# Patient Record
Sex: Female | Born: 1995 | Race: Black or African American | Hispanic: No | Marital: Married | Smoking: Never smoker
Health system: Southern US, Community
[De-identification: ages and names within clinical notes are randomized; demographics above are authoritative.]

---
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
## 2017-12-02 ENCOUNTER — Ambulatory Visit: Payer: Self-pay | Admitting: Family Medicine

## 2019-05-14 ENCOUNTER — Other Ambulatory Visit: Payer: Self-pay

## 2019-05-14 ENCOUNTER — Emergency Department: Payer: No Typology Code available for payment source

## 2019-05-14 ENCOUNTER — Emergency Department
Admission: EM | Admit: 2019-05-14 | Discharge: 2019-05-14 | Disposition: A | Discharge status: Home / Self Care | Payer: No Typology Code available for payment source | Attending: Emergency Medicine | Admitting: Emergency Medicine

## 2019-05-14 ENCOUNTER — Encounter: Payer: Self-pay | Admitting: Emergency Medicine

## 2019-05-14 DIAGNOSIS — Y92410 Unspecified street and highway as the place of occurrence of the external cause: Secondary | ICD-10-CM | POA: Diagnosis not present

## 2019-05-14 DIAGNOSIS — Y9389 Activity, other specified: Secondary | ICD-10-CM | POA: Insufficient documentation

## 2019-05-14 DIAGNOSIS — S298XXA Other specified injuries of thorax, initial encounter: Secondary | ICD-10-CM

## 2019-05-14 DIAGNOSIS — S299XXA Unspecified injury of thorax, initial encounter: Secondary | ICD-10-CM | POA: Diagnosis present

## 2019-05-14 DIAGNOSIS — Y998 Other external cause status: Secondary | ICD-10-CM | POA: Diagnosis not present

## 2019-05-14 MED ORDER — BACLOFEN 10 MG PO TABS: 10.0000 mg | ORAL_TABLET | Freq: Three times a day (TID) | ORAL | 1 refills | Status: AC

## 2019-05-14 MED ORDER — IBUPROFEN 800 MG PO TABS: 800.0000 mg | ORAL_TABLET | Freq: Three times a day (TID) | ORAL | 0 refills | Status: DC | PRN

## 2019-05-14 NOTE — Discharge Instructions (Addendum)
Follow up with your regular doctor if not better in 5 to 7 days.  Or see acute care or orthopedics.  Apply ice to the areas that hurt.  Drink plenty of water.  Return if needed
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
## 2019-10-11 ENCOUNTER — Ambulatory Visit (LOCAL_COMMUNITY_HEALTH_CENTER): Payer: Self-pay

## 2019-10-11 ENCOUNTER — Other Ambulatory Visit: Payer: Self-pay

## 2019-10-11 DIAGNOSIS — Z23 Encounter for immunization: Secondary | ICD-10-CM

## 2019-10-24 ENCOUNTER — Ambulatory Visit (LOCAL_COMMUNITY_HEALTH_CENTER): Payer: BC Managed Care – PPO

## 2019-10-24 ENCOUNTER — Other Ambulatory Visit: Payer: Self-pay

## 2019-10-24 VITALS — BP 132/83 | Ht 70.0 in | Wt 187.5 lb

## 2019-10-24 DIAGNOSIS — Z30013 Encounter for initial prescription of injectable contraceptive: Secondary | ICD-10-CM

## 2019-10-24 DIAGNOSIS — Z3009 Encounter for other general counseling and advice on contraception: Secondary | ICD-10-CM

## 2019-10-24 MED ORDER — MEDROXYPROGESTERONE ACETATE 150 MG/ML IM SUSP
150.0000 mg | Freq: Once | INTRAMUSCULAR | Status: AC
  Administered 2019-10-24: 10:00:00 150 mg via INTRAMUSCULAR

## 2019-10-24 MED ORDER — MULTI-VITAMIN/MINERALS PO TABS: 1.0000 | ORAL_TABLET | Freq: Every day | ORAL | 0 refills | Status: AC

## 2019-10-24 NOTE — Progress Notes (Signed)
Client's last [facility] physical was in 01/2017. Per [facility] record, Nexplanon removed 03/2019 and Depo initiated with last Depo administered 06/2019. Client presents today for Depo at 14 weeks, 1 day following last dose. Consult with Ola Spurr CNM with following verbal orders given: 1) Depo 150 mg IM today and 2) schedule physical when next Depo due (01/09/2020). Client counseled regarding above and voiced understanding. Tolerated Depo injection without complaint. Rich Number, RN

## 2019-11-05 ENCOUNTER — Other Ambulatory Visit: Payer: Self-pay

## 2019-11-05 ENCOUNTER — Emergency Department: Payer: Self-pay

## 2019-11-05 DIAGNOSIS — W010XXA Fall on same level from slipping, tripping and stumbling without subsequent striking against object, initial encounter: Secondary | ICD-10-CM | POA: Insufficient documentation

## 2019-11-05 DIAGNOSIS — S022XXA Fracture of nasal bones, initial encounter for closed fracture: Secondary | ICD-10-CM | POA: Insufficient documentation

## 2019-11-05 DIAGNOSIS — Y92009 Unspecified place in unspecified non-institutional (private) residence as the place of occurrence of the external cause: Secondary | ICD-10-CM | POA: Insufficient documentation

## 2019-11-05 DIAGNOSIS — Y999 Unspecified external cause status: Secondary | ICD-10-CM | POA: Insufficient documentation

## 2019-11-05 DIAGNOSIS — Y9302 Activity, running: Secondary | ICD-10-CM | POA: Insufficient documentation

## 2019-11-05 NOTE — ED Triage Notes (Signed)
Patient here tonight because she thinks her nose is broken. [place] one week ago and has had swelling to the bridge of the nose since then. Presents tonight because she wants it checked out.

## 2019-11-06 ENCOUNTER — Emergency Department
Admission: EM | Admit: 2019-11-06 | Discharge: 2019-11-06 | Disposition: A | Discharge status: Home / Self Care | Payer: Self-pay | Attending: Emergency Medicine | Admitting: Emergency Medicine

## 2019-11-06 DIAGNOSIS — S022XXA Fracture of nasal bones, initial encounter for closed fracture: Secondary | ICD-10-CM

## 2019-11-06 MED ORDER — IBUPROFEN 600 MG PO TABS: 600.0000 mg | ORAL_TABLET | Freq: Four times a day (QID) | ORAL | 0 refills | Status: AC | PRN

## 2019-11-06 NOTE — ED Notes (Signed)
MD in room with patient
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
## 2020-02-23 IMAGING — CR DG NASAL BONES 3+V
3 series · 3 of 3 positions shown · non-contrast
Comparison: None.

CLINICAL DATA: Pain

EXAM:
NASAL BONES - 3+ VIEW

[nasal waters]
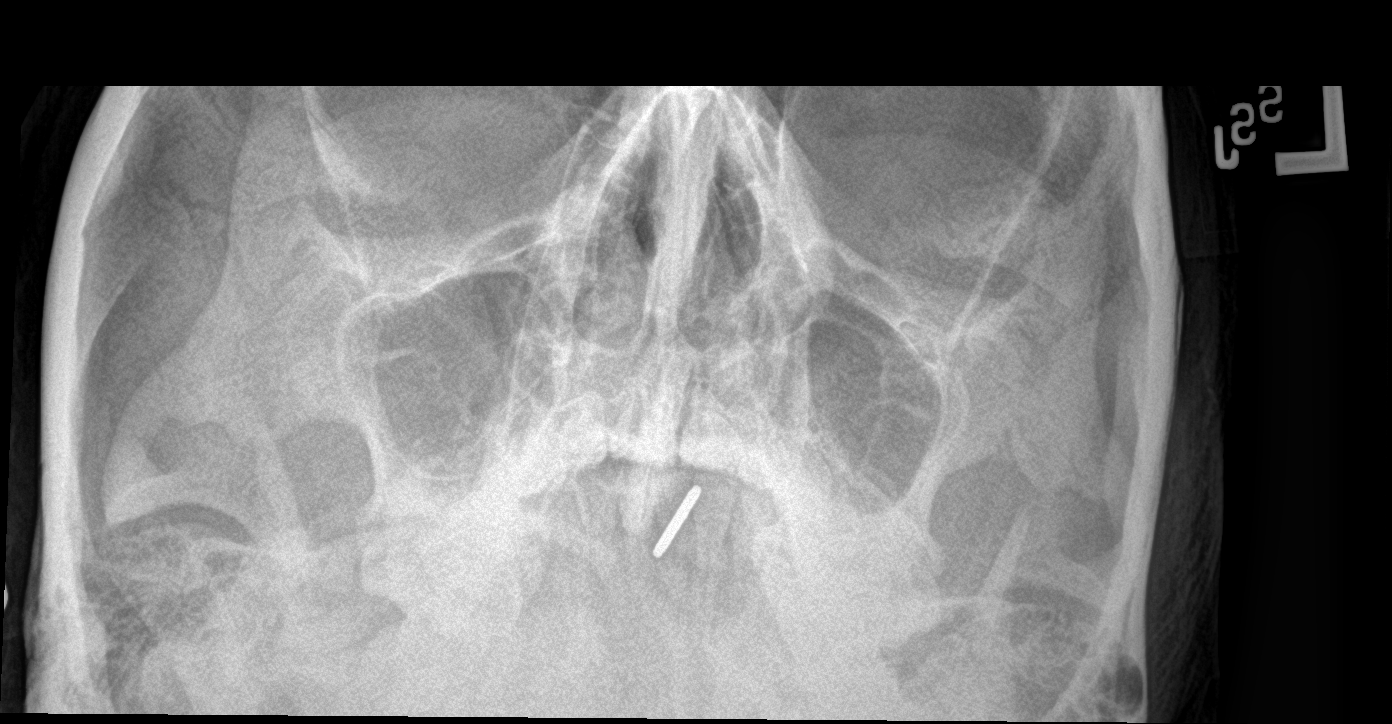

[nasal lat (1 of 2)]
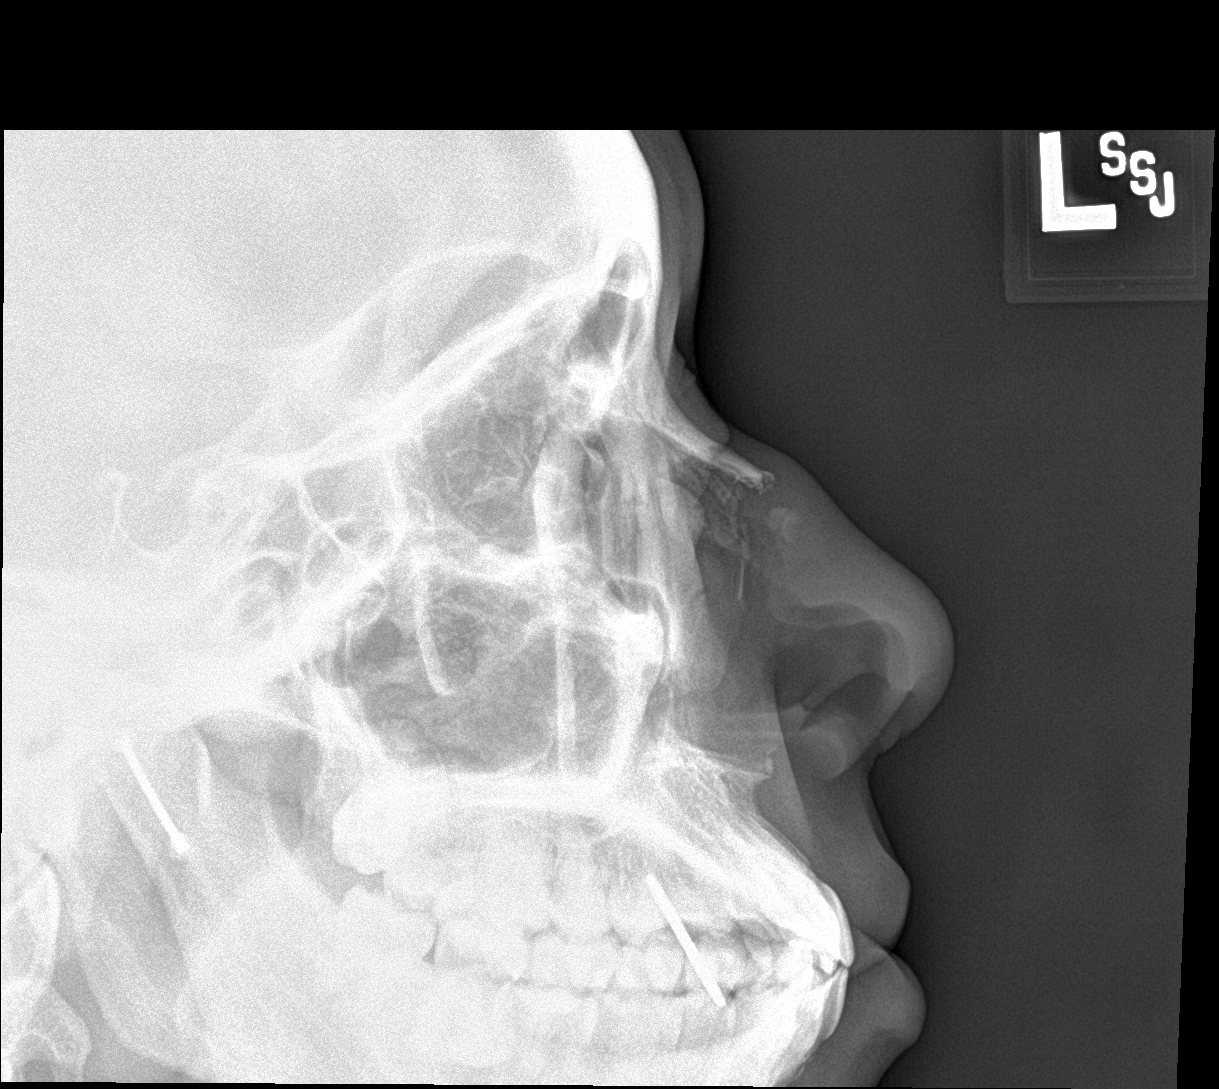

[nasal lat (2 of 2)]
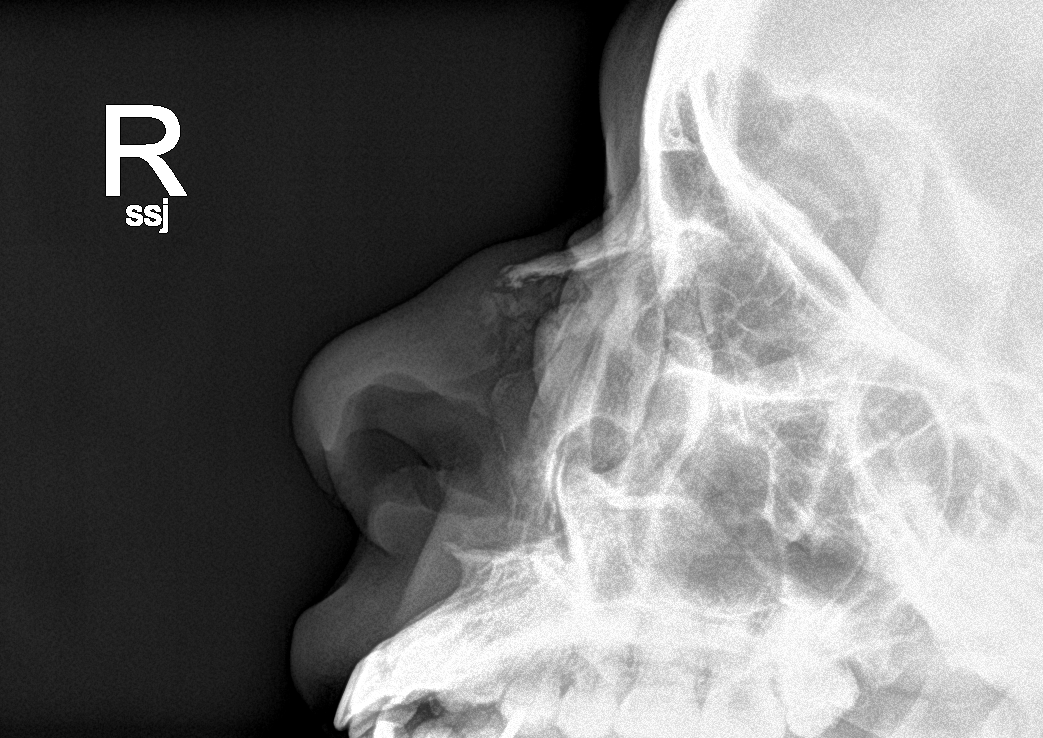

[3 of 3 positions shown; findings below may reference images not displayed]

FINDINGS: There is mild fragmentation of the nasal bones suspicious for a
subacute nondisplaced fracture. There is surrounding soft tissue
swelling.
IMPRESSION: Findings suspicious for a subacute nondisplaced fracture of the
nasal bones with surrounding soft tissue swelling.

## 2021-09-29 ENCOUNTER — Ambulatory Visit (LOCAL_COMMUNITY_HEALTH_CENTER): Payer: Medicaid Other

## 2021-09-29 ENCOUNTER — Other Ambulatory Visit: Payer: Self-pay

## 2021-09-29 VITALS — BP 130/83 | Ht 70.0 in | Wt 216.5 lb

## 2021-09-29 DIAGNOSIS — Z3201 Encounter for pregnancy test, result positive: Secondary | ICD-10-CM | POA: Diagnosis not present

## 2021-09-29 LAB — PREGNANCY, URINE: Preg Test, Ur: POSITIVE — AB

## 2021-09-29 MED ORDER — PRENATAL VITAMIN 27-0.8 MG PO TABS
1.0000 | ORAL_TABLET | ORAL | 0 refills | Status: AC
Start: 2021-09-29 — End: 2021-09-30

## 2021-09-29 NOTE — Progress Notes (Signed)
Patient desires her prenatal care at [facility]. Hart Carwin, RN
